# Patient Record
Sex: Male | Born: 1995 | Marital: Single | Smoking: Never smoker
Health system: Southern US, Community
[De-identification: ages and names within clinical notes are randomized; demographics above are authoritative.]

## PROBLEM LIST (undated history)

## (undated) DIAGNOSIS — D649 Anemia, unspecified: Secondary | ICD-10-CM

## (undated) HISTORY — DX: Anemia, unspecified: D64.9

---

## 2016-01-20 ENCOUNTER — Telehealth: Payer: Self-pay | Admitting: Family Medicine

## 2016-01-20 NOTE — Telephone Encounter (Signed)
Keep both appointments for this patient.

## 2016-01-20 NOTE — Telephone Encounter (Signed)
Just acute visit.

## 2016-01-20 NOTE — Telephone Encounter (Signed)
Ok

## 2016-01-20 NOTE — Telephone Encounter (Signed)
FYI - Pt has a new patient appointment with Dr. Adriana Simasook on 10/24, but needs to be seen acutely with M. Arnett on 10/3.  Thank you!

## 2016-01-20 NOTE — Telephone Encounter (Signed)
Would you do np and acute visit all in one or just the acute visit?

## 2016-01-27 ENCOUNTER — Ambulatory Visit (INDEPENDENT_AMBULATORY_CARE_PROVIDER_SITE_OTHER): Payer: PRIVATE HEALTH INSURANCE | Admitting: Family

## 2016-01-27 ENCOUNTER — Ambulatory Visit (INDEPENDENT_AMBULATORY_CARE_PROVIDER_SITE_OTHER): Payer: PRIVATE HEALTH INSURANCE

## 2016-01-27 ENCOUNTER — Encounter: Payer: Self-pay | Admitting: Family

## 2016-01-27 VITALS — BP 122/74 | HR 83 | Temp 97.9°F | Wt 161.0 lb

## 2016-01-27 DIAGNOSIS — J209 Acute bronchitis, unspecified: Secondary | ICD-10-CM

## 2016-01-27 MED ORDER — PROMETHAZINE-DM 6.25-15 MG/5ML PO SYRP: 5.0000 mL | ORAL_SOLUTION | Freq: Every evening | ORAL | 1 refills | Status: AC | PRN

## 2016-01-27 NOTE — Patient Instructions (Signed)

## 2016-01-27 NOTE — Progress Notes (Signed)
Subjective:    Patient ID: Adrian Wilkerson, male    DOB: November 29, 1995, 20 y.o.   MRN: [id number]  CC: Adrian Wilkerson is a 20 y.o. male who presents today for an acute visit.    HPI: Patient is here for acute visit with chief complaint of cough for 2 weeks, improving. Seen at [facility] health clinic and told to start mucinex, advil PM. Had chills early on,resolved. Had some wheezing, SOB. No fever.   H/o exercise induced asthma. Has also had to use inhaler prior to exercise. No need for inhaler refill.       HISTORY:  Past Medical History:  Diagnosis Date  . Anemia    No past surgical history on file. Family History  Problem Relation Age of Onset  . Cancer Mother   . Heart disease Father   . Hypertension Father   . Arthritis Maternal Grandmother   . Arthritis Maternal Grandfather   . Arthritis Paternal Grandmother   . Kidney disease Paternal Grandmother   . Diabetes Paternal Grandmother   . Arthritis Paternal Grandfather   . Kidney disease Paternal Grandfather   . Diabetes Paternal Grandfather     Allergies: Review of patient's allergies indicates not on file. No current outpatient prescriptions on file prior to visit.   No current facility-administered medications on file prior to visit.     Social History  Substance Use Topics  . Smoking status: Never Smoker  . Smokeless tobacco: Never Used  . Alcohol use Yes    Review of Systems  Constitutional: Negative for chills and fever.  HENT: Positive for congestion and sinus pressure. Negative for ear pain and sore throat.   Respiratory: Positive for cough, shortness of breath and wheezing.   Cardiovascular: Negative for chest pain and palpitations.  Gastrointestinal: Negative for nausea and vomiting.      Objective:    BP 122/74   Pulse 83   Temp 97.9 F (36.6 C) (Oral)   Wt 161 lb (73 kg)   SpO2 98%    Physical Exam  Constitutional: Vital signs are normal. He appears well-developed and well-nourished.    HENT:  Head: Normocephalic and atraumatic.  Right Ear: Hearing, tympanic membrane, external ear and ear canal normal. No drainage, swelling or tenderness. Tympanic membrane is not injected, not erythematous and not bulging. No middle ear effusion. No decreased hearing is noted.  Left Ear: Hearing, tympanic membrane, external ear and ear canal normal. No drainage, swelling or tenderness. Tympanic membrane is not injected, not erythematous and not bulging.  No middle ear effusion. No decreased hearing is noted.  Nose: Nose normal. Right sinus exhibits no maxillary sinus tenderness and no frontal sinus tenderness. Left sinus exhibits no maxillary sinus tenderness and no frontal sinus tenderness.  Mouth/Throat: Uvula is midline, oropharynx is clear and moist and mucous membranes are normal. No oropharyngeal exudate, posterior oropharyngeal edema, posterior oropharyngeal erythema or tonsillar abscesses.  Eyes: Conjunctivae are normal.  Cardiovascular: Regular rhythm and normal heart sounds.   Pulmonary/Chest: Effort normal and breath sounds normal. No respiratory distress. He has no wheezes. He has no rhonchi. He has no rales.  Lymphadenopathy:       Head (right side): No submental, no submandibular, no tonsillar, no preauricular, no posterior auricular and no occipital adenopathy present.       Head (left side): No submental, no submandibular, no tonsillar, no preauricular, no posterior auricular and no occipital adenopathy present.    He has no cervical adenopathy.  Neurological: He is  alert.  Skin: Skin is warm and dry.  Psychiatric: He has a normal mood and affect. His speech is normal and behavior is normal.  Vitals reviewed.      Assessment & Plan:   Problem List Items Addressed This Visit      Respiratory   Acute bronchitis - Primary    Reassured by clinical improvement. No adventitious lung sounds on exam. Patient is afebrile. Pending chest x-ray to ensure no underlying bacterial  pneumonia.      Relevant Medications   promethazine-dextromethorphan (PROMETHAZINE-DM) 6.25-15 MG/5ML syrup   Other Relevant Orders   DG Chest 2 View    Other Visit Diagnoses   None.        I am having Mr. Adrian Wilkerson start on promethazine-dextromethorphan.   Meds ordered this encounter  Medications  . promethazine-dextromethorphan (PROMETHAZINE-DM) 6.25-15 MG/5ML syrup    Sig: Take 5 mLs by mouth at bedtime as needed for cough.    Dispense:  40 mL    Refill:  1    Order Specific Question:   Supervising Provider    Answer:   Sherlene ShamsULLO, TERESA L [[id number]]    Return precautions given.   Risks, benefits, and alternatives of the medications and treatment plan prescribed today were discussed, and patient expressed understanding.   Education regarding symptom management and diagnosis given to patient on AVS.  Continue to follow with [name], DO for routine health maintenance.   [name] and I agreed with plan.   [name], FNP

## 2016-01-27 NOTE — Assessment & Plan Note (Signed)
Reassured by clinical improvement. No adventitious lung sounds on exam. Patient is afebrile. Pending chest x-ray to ensure no underlying bacterial pneumonia.

## 2016-01-27 NOTE — Progress Notes (Signed)
Pre visit review using our clinic review tool, if applicable. No additional management support is needed unless otherwise documented below in the visit note. 

## 2016-02-17 ENCOUNTER — Ambulatory Visit: Payer: PRIVATE HEALTH INSURANCE | Admitting: Family Medicine

## 2016-02-17 ENCOUNTER — Telehealth: Payer: Self-pay | Admitting: Family Medicine

## 2016-02-17 DIAGNOSIS — Z0289 Encounter for other administrative examinations: Secondary | ICD-10-CM

## 2016-02-17 NOTE — Telephone Encounter (Signed)
Pt mom called and stated that her son just called and said that he was stuck in an exam and could not make this appt.

## 2016-02-17 NOTE — Telephone Encounter (Signed)
FYI

## 2016-03-11 ENCOUNTER — Ambulatory Visit: Payer: PRIVATE HEALTH INSURANCE | Admitting: Family Medicine

## 2016-03-11 DIAGNOSIS — Z0289 Encounter for other administrative examinations: Secondary | ICD-10-CM

## 2017-02-25 ENCOUNTER — Ambulatory Visit: Payer: PRIVATE HEALTH INSURANCE | Admitting: Family Medicine

## 2017-06-30 ENCOUNTER — Emergency Department: Payer: No Typology Code available for payment source

## 2017-06-30 ENCOUNTER — Other Ambulatory Visit: Payer: Self-pay

## 2017-06-30 ENCOUNTER — Emergency Department
Admission: EM | Admit: 2017-06-30 | Discharge: 2017-06-30 | Disposition: A | Discharge status: Home / Self Care | Payer: No Typology Code available for payment source | Attending: Emergency Medicine | Admitting: Emergency Medicine

## 2017-06-30 DIAGNOSIS — K226 Gastro-esophageal laceration-hemorrhage syndrome: Secondary | ICD-10-CM | POA: Insufficient documentation

## 2017-06-30 DIAGNOSIS — R112 Nausea with vomiting, unspecified: Secondary | ICD-10-CM | POA: Diagnosis present

## 2017-06-30 LAB — COMPREHENSIVE METABOLIC PANEL
ALK PHOS: 68 U/L (ref 38–126)
ALT: 12 U/L — AB (ref 17–63)
ANION GAP: 7 (ref 5–15)
AST: 19 U/L (ref 15–41)
Albumin: 3.8 g/dL (ref 3.5–5.0)
BUN: 12 mg/dL (ref 6–20)
CALCIUM: 8.6 mg/dL — AB (ref 8.9–10.3)
CHLORIDE: 106 mmol/L (ref 101–111)
CO2: 27 mmol/L (ref 22–32)
CREATININE: 1.08 mg/dL (ref 0.61–1.24)
Glucose, Bld: 92 mg/dL (ref 65–99)
Potassium: 4.3 mmol/L (ref 3.5–5.1)
Sodium: 140 mmol/L (ref 135–145)
Total Bilirubin: 0.6 mg/dL (ref 0.3–1.2)
Total Protein: 6.9 g/dL (ref 6.5–8.1)

## 2017-06-30 LAB — URINALYSIS, COMPLETE (UACMP) WITH MICROSCOPIC
BILIRUBIN URINE: NEGATIVE
Bacteria, UA: NONE SEEN
GLUCOSE, UA: NEGATIVE mg/dL
HGB URINE DIPSTICK: NEGATIVE
KETONES UR: NEGATIVE mg/dL
LEUKOCYTES UA: NEGATIVE
Nitrite: NEGATIVE
PROTEIN: NEGATIVE mg/dL
Specific Gravity, Urine: 1.017 (ref 1.005–1.030)
Squamous Epithelial / LPF: NONE SEEN
pH: 6 (ref 5.0–8.0)

## 2017-06-30 LAB — CBC
HCT: 46.1 % (ref 40.0–52.0)
Hemoglobin: 15.2 g/dL (ref 13.0–18.0)
MCH: 29.1 pg (ref 26.0–34.0)
MCHC: 33 g/dL (ref 32.0–36.0)
MCV: 88.1 fL (ref 80.0–100.0)
Platelets: 293 10*3/uL (ref 150–440)
RBC: 5.23 MIL/uL (ref 4.40–5.90)
RDW: 13.3 % (ref 11.5–14.5)
WBC: 7 10*3/uL (ref 3.8–10.6)

## 2017-06-30 LAB — LIPASE, BLOOD: Lipase: 40 U/L (ref 11–51)

## 2017-06-30 NOTE — Discharge Instructions (Signed)
Fortunately today your chest x-ray and your blood work is reassuring.  Please follow-up with your primary care physician as needed and return to the emergency department for any concerns.  It was a pleasure to take care of you today, and thank you for coming to our emergency department.  If you have any questions or concerns before leaving please ask the nurse to grab me and I'm more than happy to go through your aftercare instructions again.  If you were prescribed any opioid pain medication today such as Norco, Vicodin, Percocet, morphine, hydrocodone, or oxycodone please make sure you do not drive when you are taking this medication as it can alter your ability to drive safely.  If you have any concerns once you are home that you are not improving or are in fact getting worse before you can make it to your follow-up appointment, please do not hesitate to call 911 and come back for further evaluation.  Merrily BrittleNeil Caylee Vlachos, MD  Results for orders placed or performed during the hospital encounter of 06/30/17  Lipase, blood  Result Value Ref Range   Lipase 40 11 - 51 U/L  Comprehensive metabolic panel  Result Value Ref Range   Sodium 140 135 - 145 mmol/L   Potassium 4.3 3.5 - 5.1 mmol/L   Chloride 106 101 - 111 mmol/L   CO2 27 22 - 32 mmol/L   Glucose, Bld 92 65 - 99 mg/dL   BUN 12 6 - 20 mg/dL   Creatinine, Ser 8.841.08 0.61 - 1.24 mg/dL   Calcium 8.6 (L) 8.9 - 10.3 mg/dL   Total Protein 6.9 6.5 - 8.1 g/dL   Albumin 3.8 3.5 - 5.0 g/dL   AST 19 15 - 41 U/L   ALT 12 (L) 17 - 63 U/L   Alkaline Phosphatase 68 38 - 126 U/L   Total Bilirubin 0.6 0.3 - 1.2 mg/dL   GFR calc non Af Amer >60 >60 mL/min   GFR calc Af Amer >60 >60 mL/min   Anion gap 7 5 - 15  CBC  Result Value Ref Range   WBC 7.0 3.8 - 10.6 K/uL   RBC 5.23 4.40 - 5.90 MIL/uL   Hemoglobin 15.2 13.0 - 18.0 g/dL   HCT 16.646.1 06.340.0 - 01.652.0 %   MCV 88.1 80.0 - 100.0 fL   MCH 29.1 26.0 - 34.0 pg   MCHC 33.0 32.0 - 36.0 g/dL   RDW 01.013.3  93.211.5 - 35.514.5 %   Platelets 293 150 - 440 K/uL   Dg Chest 2 View  Result Date: 06/30/2017 CLINICAL DATA:  Hematemesis. EXAM: CHEST - 2 VIEW COMPARISON:  01/27/2016 and prior exams FINDINGS: The cardiomediastinal silhouette is unremarkable. There is no evidence of focal airspace disease, pulmonary edema, suspicious pulmonary nodule/mass, pleural effusion, or pneumothorax. No acute bony abnormalities are identified. IMPRESSION: No active cardiopulmonary disease. Electronically Signed   By: Harmon PierJeffrey  Hu M.D.   On: 06/30/2017 14:16

## 2017-06-30 NOTE — ED Provider Notes (Signed)
[facility] Emergency Department Provider Note  ____________________________________________   First MD Initiated Contact with Patient 06/30/17 1344     (approximate)  I have reviewed the triage vital signs and the nursing notes.   HISTORY  Chief Complaint Hematemesis   HPI Adrian Wilkerson is a 22 y.o. male who self presents to the emergency department with nausea vomiting and hematemesis that began this morning.  He has a long-standing history of gastric issues and is followed up [facility] gastro[facility]ology [facility].  He feels nauseated and throws up most mornings.  This morning he retched and vomited several times and on his fourth or fifth emesis he noted a small amount of blood which is never happened to him before.  The symptoms have not recurred.  His symptoms were sudden onset.  They were moderate severity.  They have slowly improved on their own.  He is currently asymptomatic.  He denies chest pain or shortness of breath.  Symptoms are worsened by vomiting and improved with rest.  Past Medical History:  Diagnosis Date  . Anemia     Patient Active Problem List   Diagnosis Date Noted  . Acute bronchitis 01/27/2016    History reviewed. No pertinent surgical history.  Prior to Admission medications   Medication Sig Start Date End Date Taking? Authorizing Provider  promethazine-dextromethorphan (PROMETHAZINE-DM) 6.25-15 MG/5ML syrup Take 5 mLs by mouth at bedtime as needed for cough. 01/27/16   Allegra GranaArnett, Margaret G, FNP    Allergies Patient has no known allergies.  Family History  Problem Relation Age of Onset  . Cancer Mother   . Heart disease Father   . Hypertension Father   . Arthritis Maternal Grandmother   . Arthritis Maternal Grandfather   . Arthritis Paternal Grandmother   . Kidney disease Paternal Grandmother   . Diabetes Paternal Grandmother   . Arthritis Paternal Grandfather   . Kidney disease Paternal Grandfather   .  Diabetes Paternal Grandfather     Social History Social History   Tobacco Use  . Smoking status: Never Smoker  . Smokeless tobacco: Never Used  Substance Use Topics  . Alcohol use: Yes    Alcohol/week: 3.6 - 4.8 oz    Types: 6 - 8 Shots of liquor per week    Comment: 1-2 times a week  . Drug use: Yes    Types: Marijuana    Review of Systems Constitutional: No fever/chills Eyes: No visual changes. ENT: No sore throat. Cardiovascular: Denies chest pain. Respiratory: Denies shortness of breath. Gastrointestinal: Positive for abdominal pain.  Positive for nausea, positive for vomiting.  No diarrhea.  No constipation. Genitourinary: Negative for dysuria. Musculoskeletal: Negative for back pain. Skin: Negative for rash. Neurological: Negative for headaches, focal weakness or numbness.   ____________________________________________   PHYSICAL EXAM:  VITAL SIGNS: ED Triage Vitals [06/30/17 1133]  Enc Vitals Group     BP 128/74     Pulse Rate 69     Resp 15     Temp 97.8 F (36.6 C)     Temp Source Oral     SpO2 100 %     Weight 170 lb (77.1 kg)     Height 5\' 10"  (1.778 m)     Head Circumference      Peak Flow      Pain Score 0     Pain Loc      Pain Edu?      Excl. in GC?  Constitutional: Alert and oriented x4 joking laughing well-appearing nontoxic no diaphoresis speaks full clear sentences Eyes: PERRL EOMI. Head: Atraumatic. Nose: No congestion/rhinnorhea. Mouth/Throat: No trismus Neck: No stridor.   Cardiovascular: Normal rate, regular rhythm. Grossly normal heart sounds.  Good peripheral circulation. Respiratory: Normal respiratory effort.  No retractions. Lungs CTAB and moving good air Gastrointestinal: Soft nondistended nontender no rebound no guarding no peritonitis Musculoskeletal: No lower extremity edema   Neurologic:  Normal speech and language. No gross focal neurologic deficits are appreciated. Skin:  Skin is warm, dry and intact. No rash  noted. Psychiatric: Mood and affect are normal. Speech and behavior are normal.    ____________________________________________   DIFFERENTIAL includes but not limited to  Boerhaave syndrome, Mallory-Weiss tear, gastritis, upper GI bleed, lower GI bleed ____________________________________________   LABS (all labs ordered are listed, but only abnormal results are displayed)  Labs Reviewed  COMPREHENSIVE METABOLIC PANEL - Abnormal; Notable for the following components:      Result Value   Calcium 8.6 (*)    ALT 12 (*)    All other components within normal limits  LIPASE, BLOOD  CBC  URINALYSIS, COMPLETE (UACMP) WITH MICROSCOPIC    Lab work reviewed by me with no acute disease __________________________________________  EKG   ____________________________________________  RADIOLOGY  Chest x-ray reviewed by me with no acute disease ____________________________________________   PROCEDURES  Procedure(s) performed: no  Procedures  Critical Care performed: no  Observation: no ____________________________________________   INITIAL IMPRESSION / ASSESSMENT AND PLAN / ED COURSE  Pertinent labs & imaging results that were available during my care of the patient were reviewed by me and considered in my medical decision making (see chart for details).  The patient is very well-appearing hemodynamically stable with a benign abdominal exam.  Blood work is reassuring with no signs of anemia and his BUN is normal.  Chest x-ray is reassuring with no pneumomediastinum and no evidence of Boerhaave syndrome.  I had a lengthy discussion with the patient regarding the likelihood of a Mallory-Weiss tear and the importance of continuing his PPI and follow-up as scheduled.  Strict return precautions have been given and the patient verbalizes understanding and agreement the plan.      ____________________________________________   FINAL CLINICAL IMPRESSION(S) / ED  DIAGNOSES  Final diagnoses:  Mallory-Weiss tear      NEW MEDICATIONS STARTED DURING THIS VISIT:  New Prescriptions   No medications on file     Note:  This document was prepared using Dragon voice recognition software and may include unintentional dictation errors.     Merrily Brittle, MD 06/30/17 1439

## 2017-06-30 NOTE — ED Triage Notes (Signed)
Pt states that he vomited this morning and he noticed "a moderate amount of blood in vomit" - blood was dark in color

## 2019-08-08 IMAGING — CR DG CHEST 2V
1 series · 2 of 2 positions shown · non-contrast
Comparison: 01/27/2016 and prior exams

CLINICAL DATA: Hematemesis.

EXAM:
CHEST - 2 VIEW

[Series 1: dg chest 2 view · 0.14mm/px · 2 of 2 slices shown]
[im 1/2]
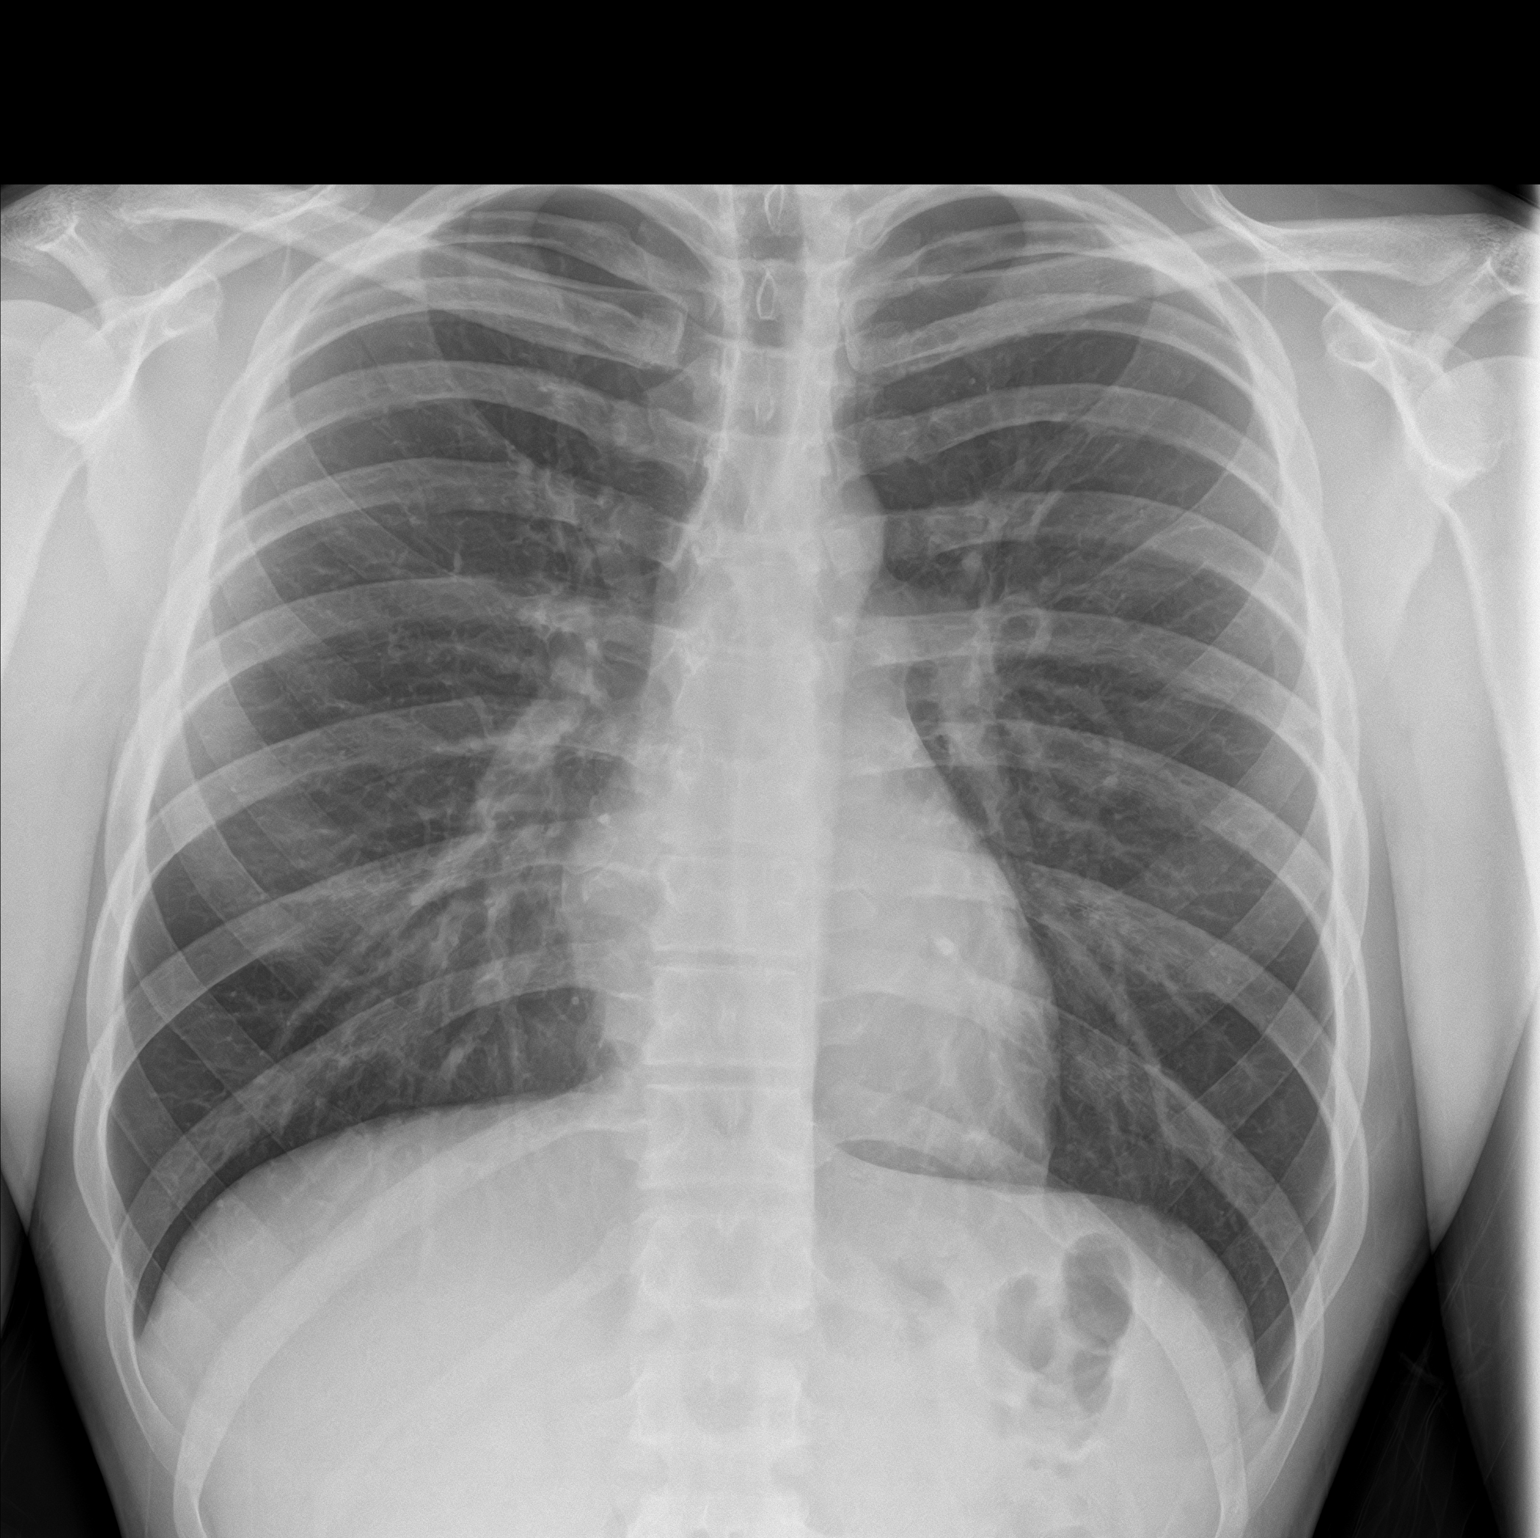
[im 2/2]
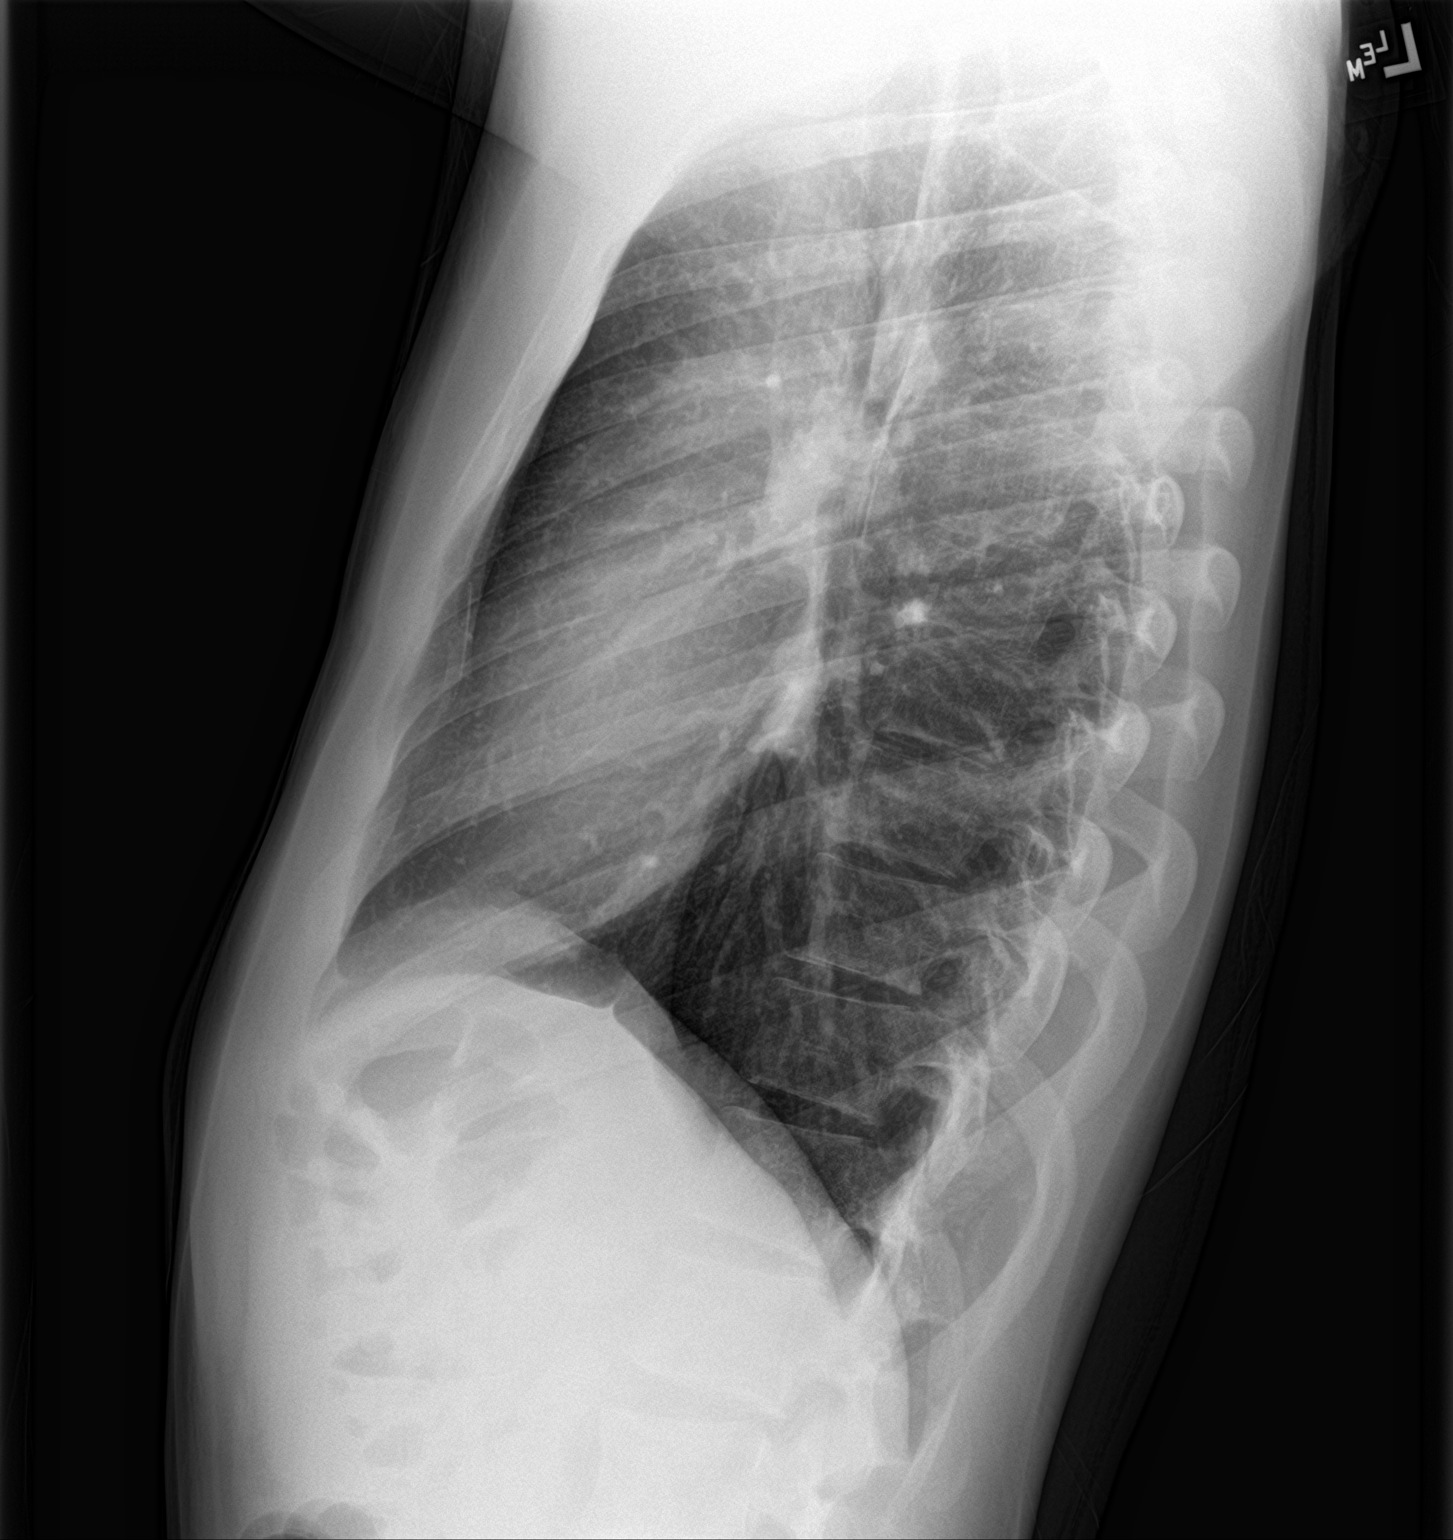

[2 of 2 positions shown; findings below may reference images not displayed]

FINDINGS: The cardiomediastinal silhouette is unremarkable.

There is no evidence of focal airspace disease, pulmonary edema,
suspicious pulmonary nodule/mass, pleural effusion, or pneumothorax.

No acute bony abnormalities are identified.
IMPRESSION: No active cardiopulmonary disease.
